# Patient Record
Sex: Female | Born: 2005 | Race: Black or African American | Hispanic: No | Marital: Single | Smoking: Never smoker
Health system: Southern US, Community
[De-identification: ages and names within clinical notes are randomized; demographics above are authoritative.]

## PROBLEM LIST (undated history)

## (undated) DIAGNOSIS — K219 Gastro-esophageal reflux disease without esophagitis: Secondary | ICD-10-CM

## (undated) DIAGNOSIS — L309 Dermatitis, unspecified: Secondary | ICD-10-CM

## (undated) HISTORY — DX: Dermatitis, unspecified: L30.9

---
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
## 2024-01-16 DIAGNOSIS — F411 Generalized anxiety disorder: Secondary | ICD-10-CM | POA: Diagnosis not present

## 2024-01-30 ENCOUNTER — Other Ambulatory Visit (HOSPITAL_COMMUNITY): Payer: Self-pay

## 2024-01-30 MED ORDER — FLUOXETINE HCL 10 MG PO CAPS
10.0000 mg | ORAL_CAPSULE | Freq: Every day | ORAL | 2 refills | Status: DC
  Filled 2024-01-30: qty 30, 30d supply, fill #0

## 2024-02-02 ENCOUNTER — Other Ambulatory Visit (HOSPITAL_COMMUNITY): Payer: Self-pay

## 2024-02-02 MED ORDER — FLUOXETINE HCL 10 MG PO TABS
10.0000 mg | ORAL_TABLET | Freq: Every day | ORAL | 0 refills | Status: AC
  Filled 2024-02-02: qty 30, 30d supply, fill #0

## 2024-02-03 ENCOUNTER — Other Ambulatory Visit (HOSPITAL_COMMUNITY): Payer: Self-pay

## 2024-02-03 DIAGNOSIS — F411 Generalized anxiety disorder: Secondary | ICD-10-CM | POA: Diagnosis not present

## 2024-02-13 DIAGNOSIS — F411 Generalized anxiety disorder: Secondary | ICD-10-CM | POA: Diagnosis not present

## 2024-03-03 ENCOUNTER — Other Ambulatory Visit (HOSPITAL_COMMUNITY): Payer: Self-pay

## 2024-03-03 MED ORDER — FLUOXETINE HCL 10 MG PO TABS
10.0000 mg | ORAL_TABLET | Freq: Every day | ORAL | 5 refills | Status: AC
  Filled 2024-03-03: qty 30, 30d supply, fill #0
  Filled 2024-03-25: qty 90, 90d supply, fill #1
  Filled 2024-04-02: qty 30, 30d supply, fill #1

## 2024-04-01 ENCOUNTER — Other Ambulatory Visit: Payer: Self-pay

## 2024-04-01 ENCOUNTER — Other Ambulatory Visit (HOSPITAL_COMMUNITY): Payer: Self-pay

## 2024-04-02 ENCOUNTER — Other Ambulatory Visit: Payer: Self-pay

## 2024-04-02 ENCOUNTER — Other Ambulatory Visit (HOSPITAL_COMMUNITY): Payer: Self-pay
